# Patient Record
Sex: Female | Born: 1935 | Race: White | Hispanic: No | Smoking: Never smoker
Health system: Southern US, Community
[De-identification: ages and names within clinical notes are randomized; demographics above are authoritative.]

## PROBLEM LIST (undated history)

## (undated) DIAGNOSIS — C801 Malignant (primary) neoplasm, unspecified: Secondary | ICD-10-CM

## (undated) DIAGNOSIS — L570 Actinic keratosis: Secondary | ICD-10-CM

## (undated) HISTORY — DX: Actinic keratosis: L57.0

---

## 1999-05-09 ENCOUNTER — Encounter: Payer: Self-pay | Admitting: Specialist

## 1999-05-11 ENCOUNTER — Ambulatory Visit (HOSPITAL_COMMUNITY): Admission: RE | Admit: 1999-05-11 | Discharge: 1999-05-11 | Payer: Self-pay | Admitting: Specialist

## 2001-01-07 ENCOUNTER — Encounter: Admission: RE | Admit: 2001-01-07 | Discharge: 2001-01-07 | Payer: Self-pay | Admitting: Family Medicine

## 2001-01-07 ENCOUNTER — Encounter: Payer: Self-pay | Admitting: Family Medicine

## 2001-07-08 ENCOUNTER — Other Ambulatory Visit: Admission: RE | Admit: 2001-07-08 | Discharge: 2001-07-08 | Payer: Self-pay | Admitting: Family Medicine

## 2002-09-15 ENCOUNTER — Ambulatory Visit (HOSPITAL_COMMUNITY): Admission: RE | Admit: 2002-09-15 | Discharge: 2002-09-15 | Payer: Self-pay | Admitting: Specialist

## 2004-02-01 ENCOUNTER — Other Ambulatory Visit: Admission: RE | Admit: 2004-02-01 | Discharge: 2004-02-01 | Payer: Self-pay | Admitting: Family Medicine

## 2004-06-06 ENCOUNTER — Other Ambulatory Visit: Payer: Self-pay

## 2005-04-27 ENCOUNTER — Ambulatory Visit: Payer: Self-pay | Admitting: Specialist

## 2008-08-20 DIAGNOSIS — C4492 Squamous cell carcinoma of skin, unspecified: Secondary | ICD-10-CM

## 2008-08-20 HISTORY — DX: Squamous cell carcinoma of skin, unspecified: C44.92

## 2011-03-31 ENCOUNTER — Ambulatory Visit
Admission: RE | Admit: 2011-03-31 | Discharge: 2011-03-31 | Disposition: A | Discharge status: Home / Self Care | Payer: Medicare Other | Source: Ambulatory Visit | Attending: Family Medicine | Admitting: Family Medicine

## 2011-03-31 ENCOUNTER — Other Ambulatory Visit: Payer: Self-pay | Admitting: Family Medicine

## 2011-03-31 DIAGNOSIS — R0609 Other forms of dyspnea: Secondary | ICD-10-CM

## 2014-09-29 ENCOUNTER — Ambulatory Visit: Payer: Self-pay | Admitting: Internal Medicine

## 2017-12-18 DIAGNOSIS — C4492 Squamous cell carcinoma of skin, unspecified: Secondary | ICD-10-CM

## 2017-12-18 DIAGNOSIS — C4491 Basal cell carcinoma of skin, unspecified: Secondary | ICD-10-CM

## 2017-12-18 HISTORY — DX: Basal cell carcinoma of skin, unspecified: C44.91

## 2017-12-18 HISTORY — DX: Squamous cell carcinoma of skin, unspecified: C44.92

## 2018-05-29 ENCOUNTER — Other Ambulatory Visit: Payer: Self-pay

## 2018-05-29 ENCOUNTER — Emergency Department
Admission: EM | Admit: 2018-05-29 | Discharge: 2018-05-29 | Disposition: A | Discharge status: Home / Self Care | Payer: Medicare Other | Attending: Emergency Medicine | Admitting: Emergency Medicine

## 2018-05-29 DIAGNOSIS — I4891 Unspecified atrial fibrillation: Secondary | ICD-10-CM | POA: Diagnosis not present

## 2018-05-29 DIAGNOSIS — L7621 Postprocedural hemorrhage and hematoma of skin and subcutaneous tissue following a dermatologic procedure: Secondary | ICD-10-CM | POA: Insufficient documentation

## 2018-05-29 DIAGNOSIS — Z7901 Long term (current) use of anticoagulants: Secondary | ICD-10-CM | POA: Diagnosis not present

## 2018-05-29 DIAGNOSIS — H9221 Otorrhagia, right ear: Secondary | ICD-10-CM

## 2018-05-29 MED ORDER — LIDOCAINE-EPINEPHRINE (PF) 2 %-1:200000 IJ SOLN
10.0000 mL | Freq: Once | INTRAMUSCULAR | Status: AC
  Administered 2018-05-29: 3 mL via INTRADERMAL
  Filled 2018-05-29: qty 10

## 2018-05-29 NOTE — ED Triage Notes (Addendum)
Pt to triage via w/c with no distress noted; granddaughter reports pt had mole removed yesterday from right ear, currently taking eloquist and cont to have bleeding

## 2018-05-29 NOTE — ED Provider Notes (Signed)
[facility] Emergency Department Provider Note   First MD Initiated Contact with Patient 05/29/18 [phone]     (approximate)  I have reviewed the triage vital signs and the nursing notes.   HISTORY  Chief Complaint Post-op Problem    HPI Victoria Pearson is a 84 y.o. female resents to the emergency department with active bleeding from right ear following squamous cell skin cancer removal yesterday.  Patient is currently on Eliquis secondary to atrial fibrillation.  Past medical history Left ear squamous cell cancer removal There are no active problems to display for this patient.   Past surgical history Left ear squamous cell removal  Prior to Admission medications   Not on File    Allergies No known drug allergies No family history on file.  Social History Social History   Tobacco Use  . Smoking status: Not on file  Substance Use Topics  . Alcohol use: Not on file  . Drug use: Not on file    Review of Systems Constitutional: No fever/chills Eyes: No visual changes. ENT: No sore throat.  Active bleeding from right ear Cardiovascular: Denies chest pain. Respiratory: Denies shortness of breath. Gastrointestinal: No abdominal pain.  No nausea, no vomiting.  No diarrhea.  No constipation. Genitourinary: Negative for dysuria. Musculoskeletal: Negative for neck pain.  Negative for back pain. Integumentary: Negative for rash. Neurological: Negative for headaches, focal weakness or numbness.  ____________________________________________   PHYSICAL EXAM:  VITAL SIGNS: ED Triage Vitals  Enc Vitals Group     BP 05/29/18 0345 (!) 179/97     Pulse Rate 05/29/18 0345 87     Resp --      Temp 05/29/18 0345 97.6 F (36.4 C)     Temp Source 05/29/18 0345 Oral     SpO2 05/29/18 0345 97 %     Weight 05/29/18 0342 57.2 kg (126 lb)     Height 05/29/18 0342 1.6 m (5\' 3" )     Head Circumference --      Peak Flow --      Pain Score 05/29/18 0342 0      Pain Loc --      Pain Edu? --      Excl. in [place]? --     Constitutional: Alert and oriented. Well appearing and in no acute distress. Eyes: Conjunctivae are normal. Head: Atraumatic. Ears: Active mild bleeding noted right acoustic meatus at the site of a skin flap Nose: No congestion/rhinnorhea. Mouth/Throat: Mucous membranes are moist.  Oropharynx non-erythematous. Neck: No stridor.  Cardiovascular: Normal rate, regular rhythm. Good peripheral circulation. Grossly normal heart sounds. Neurologic:  Normal speech and language. No gross focal neurologic deficits are appreciated.  Skin:  Skin is warm, dry and intact. No rash noted. Psychiatric: Mood and affect are normal. Speech and behavior are normal.   Procedures   ____________________________________________   INITIAL IMPRESSION / ASSESSMENT AND PLAN / ED COURSE  As part of my medical decision making, I reviewed the following data within the electronic MEDICAL RECORD NUMBER   84 year old female presented to the emergency department above-stated history and physical exam of bleeding from the right acoustic meatus following squamous cell skin cancer removal.  Surgicel applied to the area however bleeding persistent as such 1 mL of lidocaine with epinephrine was inserted subcutaneously following discussion with the patient's surgeon Dr. Lacinda Axon with resolution of bleeding achieved. ____________________________________________  FINAL CLINICAL IMPRESSION(S) / ED DIAGNOSES  Final diagnoses:  Bleeding from the ear, right  MEDICATIONS GIVEN DURING THIS VISIT:  Medications  lidocaine-EPINEPHrine (XYLOCAINE W/EPI) 2 %-1:200000 (PF) injection 10 mL (has no administration in time range)     ED Discharge Orders    None       Note:  This document was prepared using Dragon voice recognition software and may include unintentional dictation errors.    Gregor Hams, MD 05/31/18 [phone]

## 2019-04-16 ENCOUNTER — Other Ambulatory Visit: Payer: Self-pay | Admitting: Internal Medicine

## 2019-04-16 DIAGNOSIS — H9121 Sudden idiopathic hearing loss, right ear: Secondary | ICD-10-CM

## 2019-04-23 ENCOUNTER — Other Ambulatory Visit: Payer: Self-pay

## 2019-04-23 ENCOUNTER — Ambulatory Visit
Admission: RE | Admit: 2019-04-23 | Discharge: 2019-04-23 | Disposition: A | Discharge status: Home / Self Care | Payer: Medicare Other | Source: Ambulatory Visit | Attending: Internal Medicine | Admitting: Internal Medicine

## 2019-04-23 DIAGNOSIS — H9121 Sudden idiopathic hearing loss, right ear: Secondary | ICD-10-CM | POA: Diagnosis not present

## 2019-04-23 HISTORY — DX: Malignant (primary) neoplasm, unspecified: C80.1

## 2019-04-23 LAB — POCT I-STAT CREATININE: Creatinine, Ser: 0.8 mg/dL (ref 0.44–1.00)

## 2019-04-23 MED ORDER — GADOBUTROL 1 MMOL/ML IV SOLN
5.0000 mL | Freq: Once | INTRAVENOUS | Status: AC | PRN
  Administered 2019-04-23: 5 mL via INTRAVENOUS

## 2019-09-05 IMAGING — MR MR BRAIN/IAC WITHOUT AND WITH CONTRAST
10 of 13 series · 25 of 48 positions shown · IV contrast (Gadavist)
Comparison: None.

CLINICAL DATA: [AGE]/o F; history cancer removed from the ear. Right
ear hearing loss for 1 month.

EXAM:
MRI HEAD WITHOUT AND WITH CONTRAST
TECHNIQUE: Multiplanar, multiecho pulse sequences of the brain and surrounding
structures were obtained without and with intravenous contrast.
Internal auditory canal protocol.
CONTRAST:  5 cc Gadavist.

[Series 5: T1 · sagittal · 5.0mm · 0.62mm/px · 2 of 21 slices shown (1 of 3)]
[im 1/21]
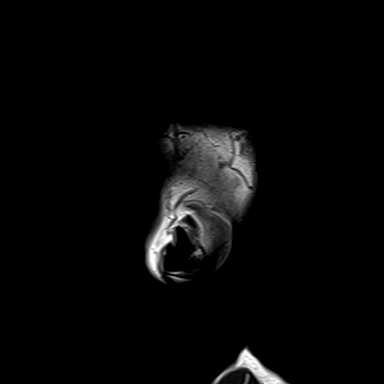
[im 21/21]
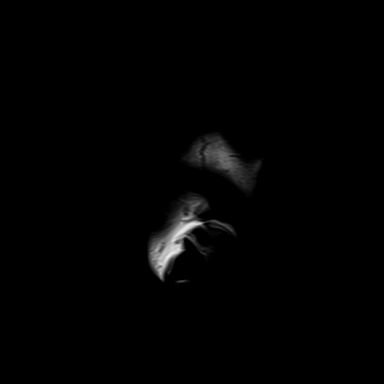

[Series 6: ax dwi_tracew · axial · 3.0mm · 0.73mm/px · z∈[-45,+103]mm · 4 of 52 slices shown]
[im 1/52]
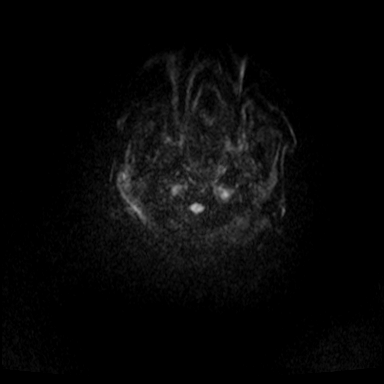
[im 18/52]
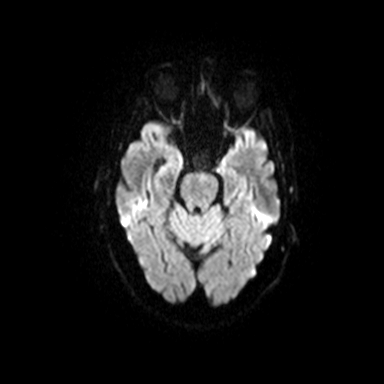
[im 35/52]
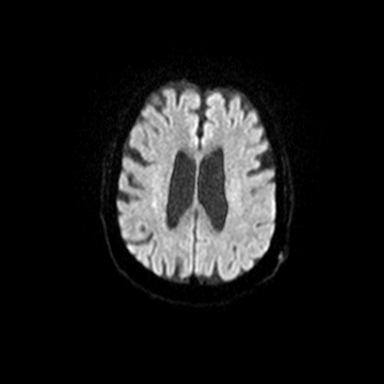
[im 52/52]
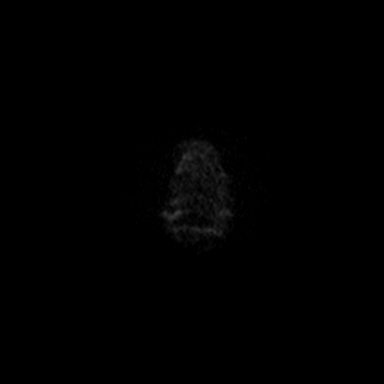

[Series 7: ax dwi_adc · axial · 3.0mm · 0.73mm/px · 1 of 52 slices shown]
[im 1/52]
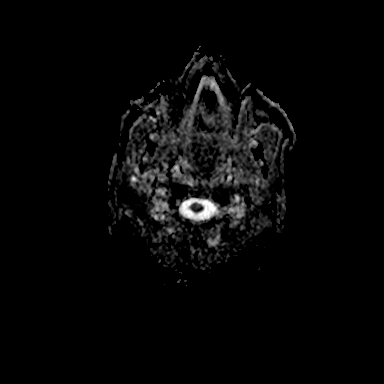

[Series 8: T2 · axial · 5.0mm · 0.53mm/px · z∈[-47,+91]mm · 2 of 25 slices shown]
[im 1/25]
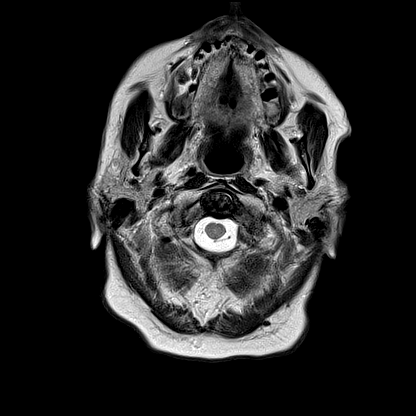
[im 25/25]
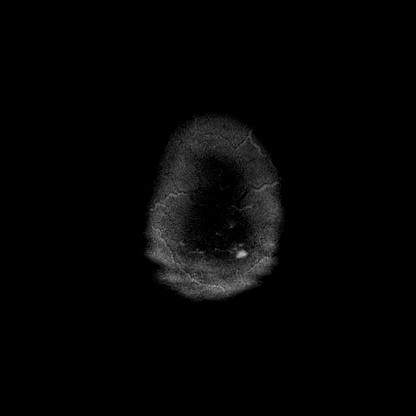

[Series 13: FLAIR · axial · 3.0mm · 0.53mm/px · z∈[-56,+100]mm · 4 of 55 slices shown]
[im 1/55]
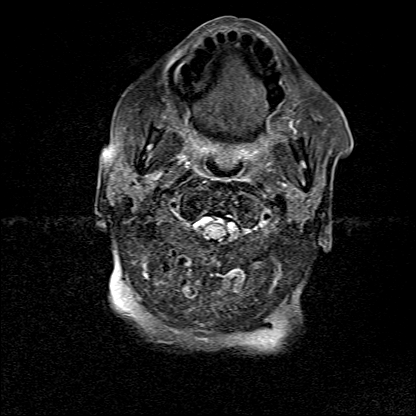
[im 19/55]
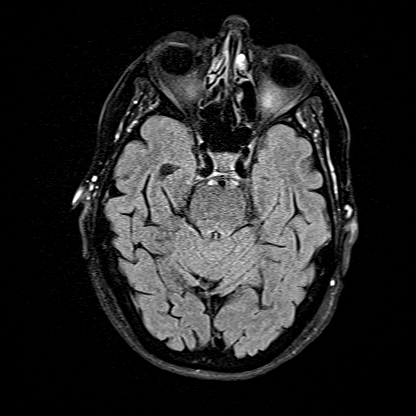
[im 37/55]
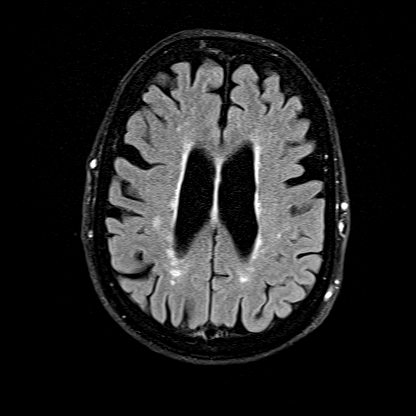
[im 55/55]
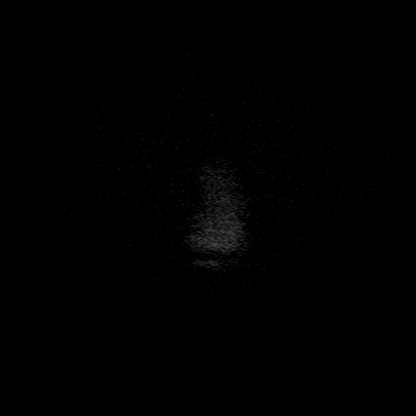

[Series 14: T1 · coronal · non-contrast · 3.0mm · 0.21mm/px · 1 of 13 slices shown (2 of 3)]
[im 1/13]
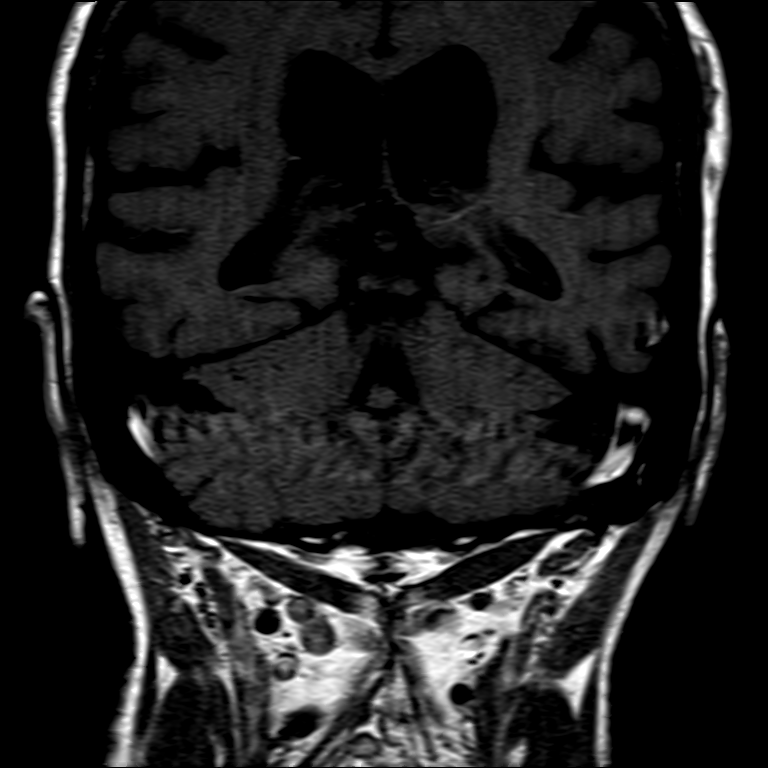

[Series 16: T1 · axial · non-contrast · 3.0mm · 0.21mm/px · 1 of 15 slices shown (3 of 3)]
[im 1/15]
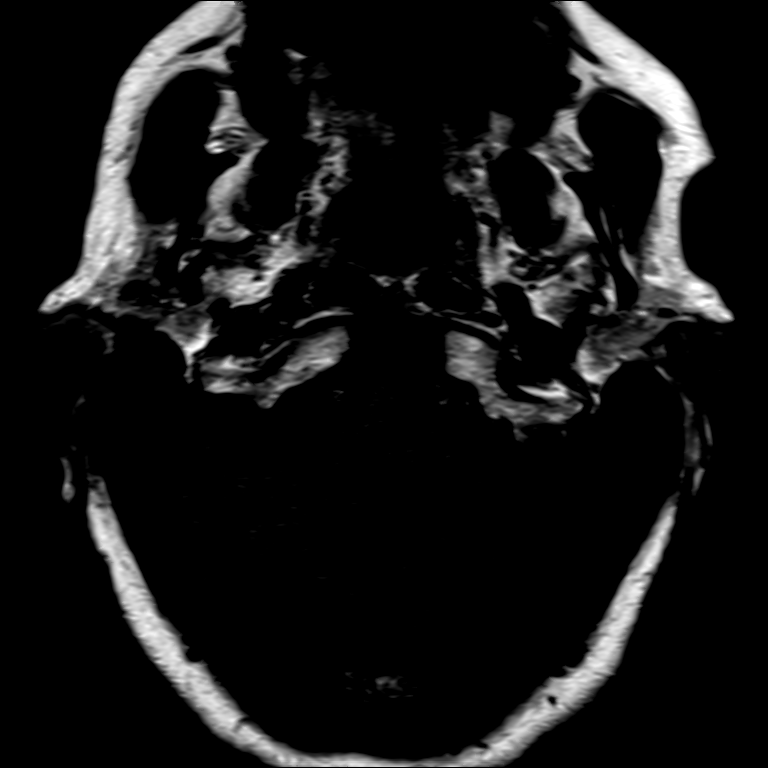

[Series 17: T1 post-contrast · axial · 3.0mm · 0.21mm/px · 1 of 15 slices shown (1 of 3)]
[im 1/15]
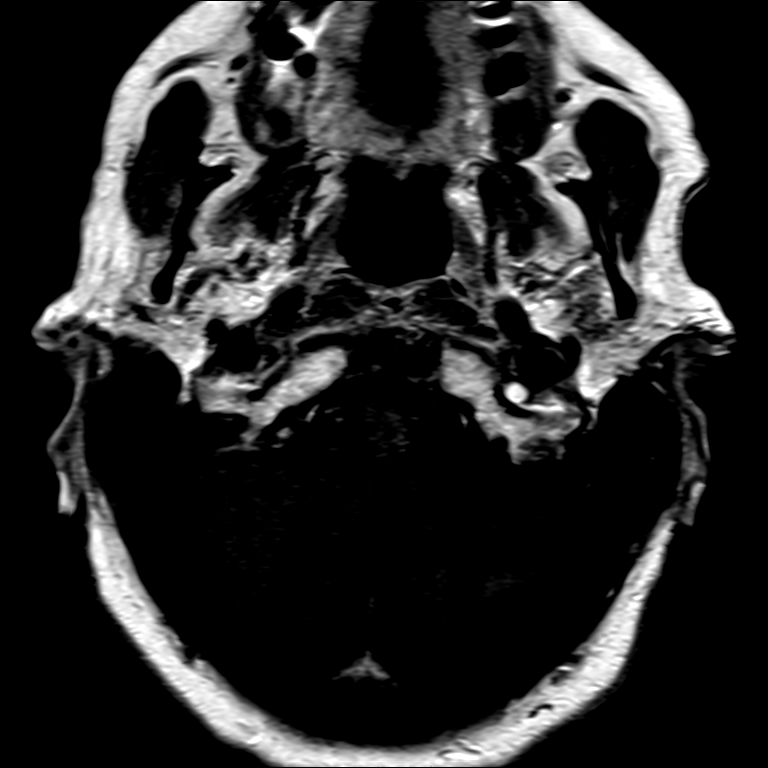

[Series 18: T1 post-contrast · coronal · 3.0mm · 0.21mm/px · 1 of 13 slices shown (2 of 3)]
[im 1/13]
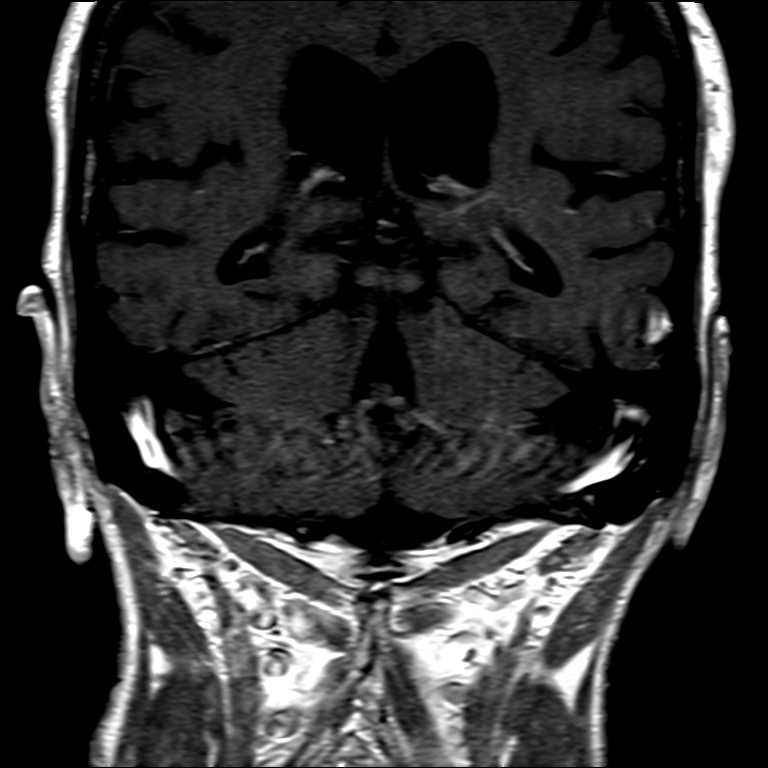

[Series 19: T1 post-contrast · axial · 1.0mm · 0.98mm/px · z∈[-59,+110]mm · 8 of 176 slices shown (3 of 3)]
[im 1/176]
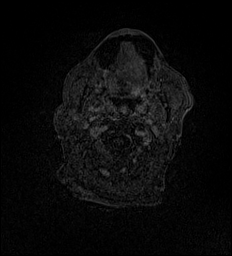
[im 27/176]
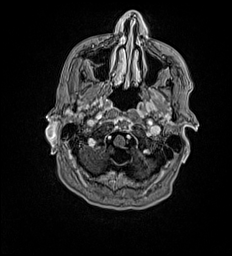
[im 54/176]
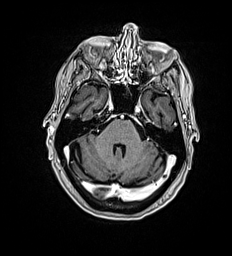
[im 81/176]
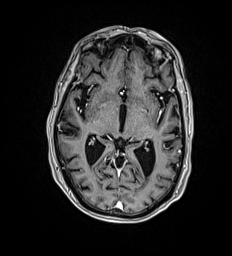
[im 95/176]
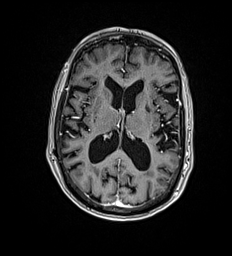
[im 122/176]
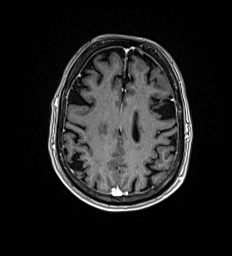
[im 149/176]
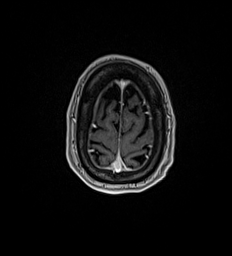
[im 176/176]
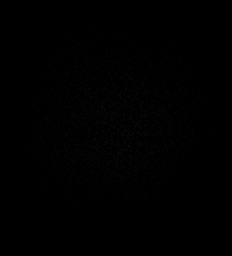

[25 of 48 positions shown; findings below may reference images not displayed]

FINDINGS: Internal auditory canal: No mass or abnormal enhancement. Cranial
nerve 7 and 8 complexes are intact. Inner ear structures are
morphologically normal and demonstrate normal signal. No high-riding
jugular bulb. No neurovascular compression. Mild non masslike
enhancement of the scalp posterior to the right external auditory
canal.

Brain: No acute infarction, hemorrhage, hydrocephalus, extra-axial
collection or mass lesion. Small hemosiderin stained chronic
infarcts are present within the left putamen and caudate head. Early
confluent nonspecific T2 FLAIR hyperintensities in subcortical and
periventricular white matter are compatible with moderate chronic
microvascular ischemic changes. Moderate volume loss of the brain.

Vascular: Normal flow voids.

Skull and upper cervical spine: Normal marrow signal.

Sinuses/Orbits: Mild mucosal thickening of the anterior ethmoid air
cells. Right maxillary sinus mucous retention cyst. Trace
opacification of mastoid tip air cells bilaterally. Bilateral
intra-ocular lens replacement.

Other: None.
IMPRESSION: 1. No mass or abnormal enhancement of the internal auditory canals.
Trace opacification of bilateral mastoid tip air cells.
2. Mild non masslike enhancement of the scalp posterior to the right
external auditory canal, question site of lesion resection.
3. No acute intracranial process or abnormal enhancement of the
brain.
4. Moderate chronic microvascular ischemic changes and volume loss
of the brain.

## 2020-02-13 ENCOUNTER — Ambulatory Visit: Payer: Medicare Other | Attending: Internal Medicine

## 2020-02-13 DIAGNOSIS — Z23 Encounter for immunization: Secondary | ICD-10-CM

## 2020-02-13 NOTE — Progress Notes (Signed)
   Covid-19 Vaccination Clinic  Name:  Victoria Pearson    MRN: [id number] DOB: 1925-06-21  02/13/2020  [name] was observed post Covid-19 immunization for 15 minutes without incidence. She was provided with Vaccine Information Sheet and instruction to access the V-Safe system.   [name] was instructed to call 911 with any severe reactions post vaccine: Marland Kitchen Difficulty breathing  . Swelling of your face and throat  . A fast heartbeat  . A bad rash all over your body  . Dizziness and weakness    Immunizations Administered    Name Date Dose VIS Date Route   Pfizer COVID-19 Vaccine 02/13/2020  1:07 PM 0.3 mL 11/28/2019 Intramuscular   Manufacturer: [place]   Lot: [id number]   [place]: [id number]

## 2020-03-16 ENCOUNTER — Ambulatory Visit: Payer: Medicare Other | Attending: Internal Medicine

## 2020-03-16 DIAGNOSIS — Z23 Encounter for immunization: Secondary | ICD-10-CM

## 2020-03-16 NOTE — Progress Notes (Signed)
   Covid-19 Vaccination Clinic  Name:  Victoria Pearson    MRN: [id number] DOB: 06-29-1925  03/16/2020  [name] was observed post Covid-19 immunization for 15 minutes without incident. She was provided with Vaccine Information Sheet and instruction to access the V-Safe system.   [name] was instructed to call 911 with any severe reactions post vaccine: Marland Kitchen Difficulty breathing  . Swelling of face and throat  . A fast heartbeat  . A bad rash all over body  . Dizziness and weakness   Immunizations Administered    Name Date Dose VIS Date Route   Pfizer COVID-19 Vaccine 03/16/2020 10:24 AM 0.3 mL 11/28/2019 Intramuscular   Manufacturer: [place]   Lot: [id number]   [place]: [id number]

## 2020-04-06 ENCOUNTER — Ambulatory Visit (INDEPENDENT_AMBULATORY_CARE_PROVIDER_SITE_OTHER): Payer: Medicare Other | Admitting: Dermatology

## 2020-04-06 ENCOUNTER — Encounter: Payer: Self-pay | Admitting: Dermatology

## 2020-04-06 ENCOUNTER — Other Ambulatory Visit: Payer: Self-pay

## 2020-04-06 DIAGNOSIS — Z85828 Personal history of other malignant neoplasm of skin: Secondary | ICD-10-CM

## 2020-04-06 DIAGNOSIS — D1801 Hemangioma of skin and subcutaneous tissue: Secondary | ICD-10-CM | POA: Diagnosis not present

## 2020-04-06 DIAGNOSIS — L570 Actinic keratosis: Secondary | ICD-10-CM

## 2020-04-06 DIAGNOSIS — L565 Disseminated superficial actinic porokeratosis (DSAP): Secondary | ICD-10-CM | POA: Diagnosis not present

## 2020-04-06 DIAGNOSIS — L578 Other skin changes due to chronic exposure to nonionizing radiation: Secondary | ICD-10-CM

## 2020-04-06 DIAGNOSIS — Z1283 Encounter for screening for malignant neoplasm of skin: Secondary | ICD-10-CM | POA: Diagnosis not present

## 2020-04-06 DIAGNOSIS — D1800 Hemangioma unspecified site: Secondary | ICD-10-CM

## 2020-04-06 DIAGNOSIS — L720 Epidermal cyst: Secondary | ICD-10-CM

## 2020-04-06 DIAGNOSIS — L821 Other seborrheic keratosis: Secondary | ICD-10-CM

## 2020-04-06 DIAGNOSIS — L814 Other melanin hyperpigmentation: Secondary | ICD-10-CM

## 2020-04-06 NOTE — Patient Instructions (Addendum)
Recommend daily broad spectrum sunscreen SPF 30+ to sun-exposed areas, reapply every 2 hours as needed. Call for new or changing lesions.  Cryotherapy Aftercare  . Wash gently with soap and water everyday.   . Apply Vaseline and Band-Aid daily until healed.  

## 2020-04-06 NOTE — Progress Notes (Signed)
Follow-Up Visit   Subjective  Victoria Pearson is a 86 y.o. female who presents for the following: Annual Exam.  Patient here today for TBSE. She has a history of BCC and SCC. She is not aware og anything new or changing today. She has a tender scaly spot on her leg that is a little better now.  The following portions of the chart were reviewed this encounter and updated as appropriate:     Review of Systems:  No other skin or systemic complaints except as noted in HPI or Assessment and Plan.  Objective  Well appearing patient in no apparent distress; mood and affect are within normal limits.  A full examination was performed including scalp, head, eyes, ears, nose, lips, neck, chest, axillae, abdomen, back, buttocks, bilateral upper extremities, bilateral lower extremities, hands, feet, fingers, toes, fingernails, and toenails. All findings within normal limits unless otherwise noted below.  Objective  Left leg and foot x 6, right leg and foot x 8, Left arm and hand x 3, Right arm and hand x 4, Left lower cheek x 1, Right lower cheek x 1 (23): Keratotic pink/brown scaly papules  Objective  Lower Legs, arms: Numerous pink brown scaly macules and papules with keratotic rim.   Objective  Left Upper Eyebrow: 5.28mm bluish papule  Objective  Right Concha, Right Sideburn: Well healed scar with no evidence of recurrence, no lymphadenopathy.   Objective  Right Postauricular: Well healed scar with no evidence of recurrence.   Objective  Right Glabella: 0.4 x 0.2cm firm white papule   Assessment & Plan  AK (actinic keratosis) (23) Left leg and foot x 6, right leg and foot x 8, Left arm and hand x 3, Right arm and hand x 4, Left lower cheek x 1, Right lower cheek x 1  Hypertrophic  Destruction of lesion - Left leg and foot x 6, right leg and foot x 8, Left arm and hand x 3, Right arm and hand x 4, Left lower cheek x 1, Right lower cheek x 1  Destruction method: cryotherapy     Informed consent: discussed and consent obtained   Lesion destroyed using liquid nitrogen: Yes   Region frozen until ice ball extended beyond lesion: Yes   Outcome: patient tolerated procedure well with no complications   Post-procedure details: wound care instructions given    DSAP (disseminated superficial actinic porokeratosis) Lower Legs, arms  Benign-appearing.  Observation.  Call clinic for new or changing spots.  Recommend daily use of broad spectrum spf 30+ sunscreen to sun-exposed areas.    Hemangioma of skin Left Upper Eyebrow  Benign, observe.     History of SCC (squamous cell carcinoma) of skin Right Concha, Right Sideburn  No evidence of recurrence, call clinic for new or changing lesions.   History of basal cell carcinoma (BCC) Right Postauricular  No evidence of recurrence, call clinic for new or changing lesions.   [place] Right Glabella  Benign, observe.     Skin cancer screening performed today.  Actinic Damage - diffuse scaly erythematous macules with underlying dyspigmentation - Recommend daily broad spectrum sunscreen SPF 30+ to sun-exposed areas, reapply every 2 hours as needed.  - Call for new or changing lesions.  Seborrheic Keratoses - Stuck-on, waxy, tan-brown papules and plaques  - Discussed benign etiology and prognosis. - Observe - Call for any changes  Lentigines - Scattered tan macules - Discussed due to sun exposure - Benign, observe - Call for any changes  Hemangiomas - Red papules - Discussed benign nature - Observe - Call for any changes   Return in about 6 months (around 10/06/2020) for AK FOLLOW UP.  Graciella Belton, RMA, am acting as scribe for Brendolyn Patty, MD .   Documentation: I have reviewed the above documentation for accuracy and completeness, and I agree with the above.  Brendolyn Patty, MD

## 2020-10-12 ENCOUNTER — Ambulatory Visit (INDEPENDENT_AMBULATORY_CARE_PROVIDER_SITE_OTHER): Payer: Medicare Other | Admitting: Dermatology

## 2020-10-12 ENCOUNTER — Other Ambulatory Visit: Payer: Self-pay

## 2020-10-12 DIAGNOSIS — L821 Other seborrheic keratosis: Secondary | ICD-10-CM | POA: Diagnosis not present

## 2020-10-12 DIAGNOSIS — C4492 Squamous cell carcinoma of skin, unspecified: Secondary | ICD-10-CM

## 2020-10-12 DIAGNOSIS — C44729 Squamous cell carcinoma of skin of left lower limb, including hip: Secondary | ICD-10-CM

## 2020-10-12 DIAGNOSIS — L570 Actinic keratosis: Secondary | ICD-10-CM | POA: Diagnosis not present

## 2020-10-12 DIAGNOSIS — L578 Other skin changes due to chronic exposure to nonionizing radiation: Secondary | ICD-10-CM

## 2020-10-12 DIAGNOSIS — D485 Neoplasm of uncertain behavior of skin: Secondary | ICD-10-CM

## 2020-10-12 DIAGNOSIS — Z85828 Personal history of other malignant neoplasm of skin: Secondary | ICD-10-CM

## 2020-10-12 DIAGNOSIS — C44722 Squamous cell carcinoma of skin of right lower limb, including hip: Secondary | ICD-10-CM

## 2020-10-12 HISTORY — DX: Personal history of other malignant neoplasm of skin: Z85.828

## 2020-10-12 MED ORDER — MUPIROCIN 2 % EX OINT: TOPICAL_OINTMENT | CUTANEOUS | 0 refills | Status: DC

## 2020-10-12 NOTE — Progress Notes (Signed)
Follow-Up Visit   Subjective  Victoria Pearson is a 86 y.o. female who presents for the following: Follow-up (6 mo f/u AKs) and Spot Check (Pt has a spot on left lower leg and right lower leg that she would like checked).  They are new, growing and tender.    The following portions of the chart were reviewed this encounter and updated as appropriate:     Review of Systems: No other skin or systemic complaints except as noted in HPI or Assessment and Plan.   Objective  Well appearing patient in no apparent distress; mood and affect are within normal limits.  A focused examination was performed including face, scalp, chest, arms, hands, legs, and feet. Relevant physical exam findings are noted in the Assessment and Plan.  Objective  right lower pretibia: 1.5 cm pink keratotic nodule     Objective  left lateral upper ankle: 1 cm pink keratotic papule     Objective  right ankle x 3, right foot x 1, right medial calf x 1, left pretibia x 1, right hand x 1, right elbow x 1, upper sternum x 1, right antihelix x 1, scalp x 1 (11): Erythematous thin papules/macules with gritty scale.  Keratotic macules/papules  Assessment & Plan  Neoplasm of uncertain behavior of skin (2) right lower pretibia  Skin / nail biopsy Type of biopsy: tangential   Informed consent: discussed and consent obtained   Anesthesia: the lesion was anesthetized in a standard fashion   Anesthesia comment:  Area prepped with alcohol Anesthetic:  1% lidocaine w/ epinephrine 1-100,000 buffered w/ 8.4% NaHCO3 Instrument used: flexible razor blade   Hemostasis achieved with: pressure, aluminum chloride and electrodesiccation   Outcome: patient tolerated procedure well    Destruction of lesion  Destruction method: electrodesiccation and curettage   Timeout:  patient name, date of birth, surgical site, and procedure verified Curettage performed in three different directions: Yes   Electrodesiccation  performed over the curetted area: Yes   Lesion length (cm):  1.5 Lesion width (cm):  1.5 Margin per side (cm):  0.2 Final wound size (cm):  1.9 Hemostasis achieved with:  pressure, aluminum chloride and electrodesiccation Outcome: patient tolerated procedure well with no complications   Post-procedure details: wound care instructions given   Additional details:  Mupirocin ointment and Bandaid applied    mupirocin ointment (BACTROBAN) 2 %  Specimen 1 - Surgical pathology Differential Diagnosis: Rule out SCC Check Margins: No 1.5 cm pink keratotic nodule  left lateral upper ankle  Skin / nail biopsy Type of biopsy: tangential   Informed consent: discussed and consent obtained   Anesthesia: the lesion was anesthetized in a standard fashion   Anesthesia comment:  Area prepped with alcohol Anesthetic:  1% lidocaine w/ epinephrine 1-100,000 buffered w/ 8.4% NaHCO3 Instrument used: flexible razor blade   Hemostasis achieved with: pressure, aluminum chloride and electrodesiccation   Outcome: patient tolerated procedure well    Destruction of lesion  Destruction method: electrodesiccation and curettage   Timeout:  patient name, date of birth, surgical site, and procedure verified Curettage performed in three different directions: Yes   Electrodesiccation performed over the curetted area: Yes   Lesion length (cm):  1 Lesion width (cm):  1 Margin per side (cm):  0.2 Final wound size (cm):  1.4 Hemostasis achieved with:  pressure, aluminum chloride and electrodesiccation Outcome: patient tolerated procedure well with no complications   Post-procedure details: wound care instructions given   Additional details:  Mupirocin ointment  and Bandaid applied    mupirocin ointment (BACTROBAN) 2 %  Specimen 2 - Surgical pathology Differential Diagnosis: ISK vs SCC Check Margins: No 1 cm pink keratotic papule  Apply mupirocin 2% ointment to both biopsy sites once a day and cover with  bandage.  Wear compression hose during day  AK (actinic keratosis) (11) right ankle x 3, right foot x 1, right medial calf x 1, left pretibia x 1, right hand x 1, right elbow x 1, upper sternum x 1, right antihelix x 1, scalp x 1  Hypertrophic Aks vs ISKs  Destruction of lesion - right ankle x 3, right foot x 1, right medial calf x 1, left pretibia x 1, right hand x 1, right elbow x 1, upper sternum x 1, right antihelix x 1, scalp x 1  Destruction method: cryotherapy   Informed consent: discussed and consent obtained   Lesion destroyed using liquid nitrogen: Yes   Region frozen until ice ball extended beyond lesion: Yes   Outcome: patient tolerated procedure well with no complications   Post-procedure details: wound care instructions given   Additional details:  Prior to procedure, discussed risks of blister formation, small wound, skin dyspigmentation, or rare scar following cryotherapy.   Seborrheic Keratoses - Stuck-on, waxy, tan-brown papules and plaques  - Discussed benign etiology and prognosis. - Observe - Call for any changes  Actinic Damage - diffuse scaly erythematous macules with underlying dyspigmentation - Recommend daily broad spectrum sunscreen SPF 30+ to sun-exposed areas, reapply every 2 hours as needed.  - Call for new or changing lesions.    Return in about 1 month (around 11/12/2020) for wound check.   I, Harriett Sine, CMA, am acting as scribe for Brendolyn Patty, MD.  Documentation: I have reviewed the above documentation for accuracy and completeness, and I agree with the above.  Brendolyn Patty MD

## 2020-10-12 NOTE — Patient Instructions (Addendum)
Wound Care Instructions  1. Cleanse wound gently with soap and water once a day then pat dry with clean gauze. Apply a thing coat of Mupirocin (Bactroban) over the wound (unless you have an allergy to this). We recommend that you use a new, sterile tube of Mupirocin. Do not pick or remove scabs. Do not remove the yellow or white "healing tissue" from the base of the wound.  2. Cover the wound with fresh, clean, nonstick gauze and secure with paper tape. You may use Band-Aids in place of gauze and tape if the would is small enough, but would recommend trimming much of the tape off as there is often too much. Sometimes Band-Aids can irritate the skin.  3. You should call the office for your biopsy report after 1 week if you have not already been contacted.  4. If you experience any problems, such as abnormal amounts of bleeding, swelling, significant bruising, significant pain, or evidence of infection, please call the office immediately.  5. FOR ADULT SURGERY PATIENTS: If you need something for pain relief you may take 1 extra strength Tylenol (acetaminophen) AND 2 Ibuprofen (200mg  each) together every 4 hours as needed for pain. (do not take these if you are allergic to them or if you have a reason you should not take them.) Typically, you may only need pain medication for 1 to 3 days.

## 2020-10-18 ENCOUNTER — Telehealth: Payer: Self-pay

## 2020-10-18 NOTE — Telephone Encounter (Signed)
Advised patient both biopsies were SCC and were treated with EDC at the time of biopsy.

## 2020-10-18 NOTE — Telephone Encounter (Signed)
-----   Message from Brendolyn Patty, MD sent at 10/18/2020  9:58 AM EDT ----- 1. Skin , right lower pretibia WELL DIFFERENTIATED SQUAMOUS CELL CARCINOMA 2. Skin , left lateral upper ankle WELL DIFFERENTIATED SQUAMOUS CELL CARCINOMA  Both SCC skin cancers and both were treated at time of biopsy with [facility]

## 2020-11-16 ENCOUNTER — Ambulatory Visit (INDEPENDENT_AMBULATORY_CARE_PROVIDER_SITE_OTHER): Payer: Medicare Other | Admitting: Dermatology

## 2020-11-16 ENCOUNTER — Other Ambulatory Visit: Payer: Self-pay

## 2020-11-16 DIAGNOSIS — L578 Other skin changes due to chronic exposure to nonionizing radiation: Secondary | ICD-10-CM | POA: Diagnosis not present

## 2020-11-16 DIAGNOSIS — Z85828 Personal history of other malignant neoplasm of skin: Secondary | ICD-10-CM | POA: Diagnosis not present

## 2020-11-16 DIAGNOSIS — L570 Actinic keratosis: Secondary | ICD-10-CM | POA: Diagnosis not present

## 2020-11-16 NOTE — Progress Notes (Signed)
   Follow-Up Visit   Subjective  Victoria Pearson is a 86 y.o. female who presents for the following: Wound Check (check bx sites, left lateral upper ankle and right lowe pretibia, both SCC treated wtih EDC last visit, ) and Follow-up (AK f/u for right ankle x 3, right foot x 1, right medial calf x 1, left pretibia x 1, right hand x 1, right elbow x 1, upper sternum x 1, right antihelix x 1, scalp x 1).    The following portions of the chart were reviewed this encounter and updated as appropriate:     Review of Systems: No other skin or systemic complaints except as noted in HPI or Assessment and Plan.   Objective  Well appearing patient in no apparent distress; mood and affect are within normal limits.  A focused examination was performed including scalp, arms, legs, right foot. Relevant physical exam findings are noted in the Assessment and Plan.  Objective  Right Lower Pretibia, left lateral upper ankle: Biopsy proven SCC, treated with EDC, Well healed scar with no evidence of recurrence  Objective  left upper pretibia x 2 (2): Pink scaly papules, other previously treated AK sites are clear  Assessment & Plan  History of SCC (squamous cell carcinoma) of skin (2) left lateral upper ankle; Right Lower Pretibia  Clear. Observe for recurrence. Call clinic for new or changing lesions.  Recommend regular skin exams, daily broad-spectrum spf 30+ sunscreen use, and photoprotection.     AK (actinic keratosis) (2) left upper pretibia x 2  Prior to procedure, discussed risks of blister formation, small wound, skin dyspigmentation, or rare scar following cryotherapy.      Destruction of lesion - left upper pretibia x 2  Destruction method: cryotherapy   Informed consent: discussed and consent obtained   Lesion destroyed using liquid nitrogen: Yes   Region frozen until ice ball extended beyond lesion: Yes   Outcome: patient tolerated procedure well with no complications     Post-procedure details: wound care instructions given    Actinic Damage - chronic, secondary to cumulative UV radiation exposure/sun exposure over time - diffuse scaly erythematous macules with underlying dyspigmentation - Recommend daily broad spectrum sunscreen SPF 30+ to sun-exposed areas, reapply every 2 hours as needed.  - Call for new or changing lesions.   Return in about 6 months (around 05/16/2021) for TBSE.   I, Harriett Sine, CMA, am acting as scribe for Brendolyn Patty, MD.  Documentation: I have reviewed the above documentation for accuracy and completeness, and I agree with the above.  Brendolyn Patty MD

## 2021-04-25 ENCOUNTER — Ambulatory Visit: Payer: Medicare Other | Admitting: Dermatology

## 2022-10-09 ENCOUNTER — Other Ambulatory Visit: Payer: Self-pay | Admitting: Otolaryngology

## 2022-10-09 DIAGNOSIS — R221 Localized swelling, mass and lump, neck: Secondary | ICD-10-CM

## 2022-10-26 ENCOUNTER — Ambulatory Visit
Admission: RE | Admit: 2022-10-26 | Discharge: 2022-10-26 | Disposition: A | Discharge status: Home / Self Care | Payer: Medicare Other | Source: Ambulatory Visit | Attending: Otolaryngology | Admitting: Otolaryngology

## 2022-10-26 DIAGNOSIS — R221 Localized swelling, mass and lump, neck: Secondary | ICD-10-CM

## 2022-10-26 MED ORDER — IOPAMIDOL (ISOVUE-300) INJECTION 61%
75.0000 mL | Freq: Once | INTRAVENOUS | Status: AC | PRN
  Administered 2022-10-26: 75 mL via INTRAVENOUS

## 2022-11-01 ENCOUNTER — Other Ambulatory Visit: Payer: Self-pay | Admitting: Otolaryngology

## 2022-11-01 DIAGNOSIS — R599 Enlarged lymph nodes, unspecified: Secondary | ICD-10-CM

## 2022-11-03 ENCOUNTER — Ambulatory Visit
Admission: RE | Admit: 2022-11-03 | Discharge: 2022-11-03 | Disposition: A | Discharge status: Home / Self Care | Payer: Medicare Other | Source: Ambulatory Visit | Attending: Otolaryngology | Admitting: Otolaryngology

## 2022-11-03 DIAGNOSIS — C770 Secondary and unspecified malignant neoplasm of lymph nodes of head, face and neck: Secondary | ICD-10-CM | POA: Diagnosis not present

## 2022-11-03 DIAGNOSIS — R599 Enlarged lymph nodes, unspecified: Secondary | ICD-10-CM | POA: Insufficient documentation

## 2022-11-03 MED ORDER — LIDOCAINE HCL (PF) 1 % IJ SOLN
10.0000 mL | Freq: Once | INTRAMUSCULAR | Status: AC
  Administered 2022-11-03: 10 mL via INTRADERMAL

## 2022-11-07 LAB — SURGICAL PATHOLOGY

## 2022-11-15 ENCOUNTER — Other Ambulatory Visit: Payer: Medicare Other

## 2022-11-29 ENCOUNTER — Inpatient Hospital Stay: Payer: Medicare Other

## 2022-11-29 ENCOUNTER — Encounter: Payer: Self-pay | Admitting: Oncology

## 2022-11-29 ENCOUNTER — Other Ambulatory Visit: Payer: Self-pay | Admitting: *Deleted

## 2022-11-29 ENCOUNTER — Inpatient Hospital Stay: Payer: Medicare Other | Admitting: Oncology

## 2022-11-29 ENCOUNTER — Inpatient Hospital Stay: Payer: Medicare Other | Attending: Oncology | Admitting: Oncology

## 2022-11-29 VITALS — BP 169/78 | HR 88 | Temp 96.4°F | Wt 124.0 lb

## 2022-11-29 DIAGNOSIS — C7800 Secondary malignant neoplasm of unspecified lung: Secondary | ICD-10-CM | POA: Insufficient documentation

## 2022-11-29 DIAGNOSIS — R918 Other nonspecific abnormal finding of lung field: Secondary | ICD-10-CM | POA: Diagnosis not present

## 2022-11-29 DIAGNOSIS — Z85828 Personal history of other malignant neoplasm of skin: Secondary | ICD-10-CM | POA: Insufficient documentation

## 2022-11-29 DIAGNOSIS — G893 Neoplasm related pain (acute) (chronic): Secondary | ICD-10-CM | POA: Insufficient documentation

## 2022-11-29 DIAGNOSIS — C760 Malignant neoplasm of head, face and neck: Secondary | ICD-10-CM | POA: Insufficient documentation

## 2022-11-29 DIAGNOSIS — C770 Secondary and unspecified malignant neoplasm of lymph nodes of head, face and neck: Secondary | ICD-10-CM | POA: Insufficient documentation

## 2022-11-29 DIAGNOSIS — C779 Secondary and unspecified malignant neoplasm of lymph node, unspecified: Secondary | ICD-10-CM

## 2022-11-29 DIAGNOSIS — C801 Malignant (primary) neoplasm, unspecified: Secondary | ICD-10-CM | POA: Diagnosis present

## 2022-12-02 ENCOUNTER — Encounter: Payer: Self-pay | Admitting: Oncology

## 2022-12-03 ENCOUNTER — Encounter: Payer: Self-pay | Admitting: Oncology

## 2022-12-03 NOTE — Progress Notes (Signed)
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 Kathyrn Sheriff  Date of visit: 12/03/22   History of presenting illness- Patient is a 88 year old female with a past medical history significant for hypertension hyperlipidemia among other medical problems.  She incidentally noticed a right neck mass for which she was seen by ENT Dr. Yvone Neu and underwent a CT soft tissue neck.  CT showed a 3.4 cm mass in the upper right lateral neck with necrotic center.  Mass is in close proximity to the transverse process of the C1 ring and close proximity to the right carotid sheath.  Sternocleidomastoid and deeper muscular neck invasion.  Small nodules in bilateral lung apex concerning for pulmonary metastatic disease.  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  Patient is doing well for her age and lives independently.  Her granddaughter lives opposite to her and is available for helping her out with her ADLs.  Patient denies any pain at the site of neck mass presently  ECOG PS- 2  Pain scale- 0   Review of systems- Review of Systems  Constitutional:  Negative for chills, fever, malaise/fatigue and weight loss.  HENT:  Negative for congestion, ear discharge and nosebleeds.        Right neck mass  Eyes:  Negative for blurred vision.  Respiratory:  Negative for cough, hemoptysis, sputum production, shortness of breath and wheezing.   Cardiovascular:  Negative for chest pain, palpitations, orthopnea and  claudication.  Gastrointestinal:  Negative for abdominal pain, blood in stool, constipation, diarrhea, heartburn, melena, nausea and vomiting.  Genitourinary:  Negative for dysuria, flank pain, frequency, hematuria and urgency.  Musculoskeletal:  Negative for back pain, joint pain and myalgias.  Skin:  Negative for rash.  Neurological:  Negative for dizziness, tingling, focal weakness, seizures, weakness and headaches.  Endo/Heme/Allergies:  Does not bruise/bleed easily.  Psychiatric/Behavioral:  Negative for depression and suicidal ideas. The patient does not have insomnia.     Allergies  Allergen Reactions   Amoxicillin Itching    There are no problems to display for this patient.    Past Medical History:  Diagnosis Date   Actinic keratosis    Basal cell carcinoma 04/02/2018   Right postauricular. Nodular pattern, ulcerated.    Cancer ([place])    History of SCC (squamous cell carcinoma) of skin 10/12/2020   right lower pretibia  and left lateral upper ankle   Squamous cell carcinoma of skin 2019   Right concha ear.    Squamous cell carcinoma of skin 08/20/2008   Right preauricular. WD SCC with superficial  infiltration. Excised 10/01/2008.   Squamous cell carcinoma of skin 10/01/2018   Right sideburn. WD SCC.     History reviewed. No pertinent surgical history.  Social History   Socioeconomic History   Marital status: Widowed    Spouse name: Not on file   Number of children: Not on file   Years of education: Not on file   Highest education level: Not on file  Occupational History   Not on  file  Tobacco Use   Smoking status: Never   Smokeless tobacco: Not on file  Substance and Sexual Activity   Alcohol use: Not Currently   Drug use: Not Currently   Sexual activity: Not Currently  Other Topics Concern   Not on file  Social History Narrative   Not on file   Social Determinants of Health   Financial Resource Strain: Not on file  Food Insecurity: Not on file   Transportation Needs: Not on file  Physical Activity: Not on file  Stress: Not on file  Social Connections: Not on file  Intimate Partner Violence: Not on file     History reviewed. No pertinent family history.   Current Outpatient Medications:    ELIQUIS 5 MG TABS tablet, Take 5 mg by mouth 2 (two) times daily., Disp: , Rfl:    felodipine (PLENDIL) 10 MG 24 hr tablet, Take by mouth., Disp: , Rfl:    hydrochlorothiazide (HYDRODIURIL) 25 MG tablet, Take 25 mg by mouth daily., Disp: , Rfl:    metoprolol succinate (TOPROL-XL) 25 MG 24 hr tablet, Take by mouth., Disp: , Rfl:    omeprazole (PRILOSEC) 20 MG capsule, TAKE 1 CAPSULE DAILY, Disp: , Rfl:    potassium chloride SA (KLOR-CON) 20 MEQ tablet, TAKE 2 TABLETS ONCE DAILY, Disp: , Rfl:    acetaminophen (TYLENOL) 650 MG CR tablet, Take by mouth., Disp: , Rfl:    aspirin EC 81 MG tablet, Take by mouth., Disp: , Rfl:    hydrALAZINE (APRESOLINE) 50 MG tablet, Take 50 mg by mouth 2 (two) times daily., Disp: , Rfl:    losartan (COZAAR) 100 MG tablet, Take 100 mg by mouth daily., Disp: , Rfl:    mupirocin ointment (BACTROBAN) 2 %, Apply to aa's once a day (Patient not taking: Reported on 11/29/2022), Disp: 30 g, Rfl: 0   Physical exam:  Vitals:   11/29/22 1332  BP: (!) 169/78  Pulse: 88  Temp: (!) 96.4 F (35.8 C)  TempSrc: Tympanic  SpO2: 99%  Weight: 124 lb (56.2 kg)   Physical Exam Neck:     Comments: There is a hard palpable ill-defined mass roughly measuring 4 cm in the lateral aspect of the right neck.  No other palpable areas of cervical adenopathy. Cardiovascular:     Rate and Rhythm: Normal rate and regular rhythm.     Heart sounds: Normal heart sounds.  Pulmonary:     Effort: Pulmonary effort is normal.     Breath sounds: Normal breath sounds.  Abdominal:     General: Bowel sounds are normal.     Palpations: Abdomen is soft.  Skin:    General: Skin is warm and dry.  Neurological:     Mental Status: She is alert  and oriented to person, place, and time.           Latest Ref Rng & Units 04/23/2019    5:56 PM  CMP  Creatinine 0.44 - 1.00 mg/dL 0.80        No data to display          No images are attached to the encounter.  [place] CORE BIOPSY (LYMPH NODES)  Result Date: 11/03/2022 INDICATION: Enlarged right neck mass/lymph node EXAM: Ultrasound-guided core needle biopsy of right neck mass/lymph node MEDICATIONS: None. ANESTHESIA/SEDATION: Local analgesia COMPLICATIONS: None immediate. PROCEDURE: Informed written consent was obtained from the patient after a thorough discussion of the procedural risks, benefits and alternatives. All questions were addressed. Maximal Sterile Barrier Technique  was utilized including caps, mask, sterile gowns, sterile gloves, sterile drape, hand hygiene and skin antiseptic. A timeout was performed prior to the initiation of the procedure. The patient was placed supine on the exam table. Ultrasound of the right neck demonstrated enlarged heterogeneous right neck mass/lymph node which appeared to invade the local muscular structures resulting in indistinct borders. Skin entry site was marked, and the overlying skin was prepped draped in the standard sterile fashion. Local analgesia was obtained with 1% lidocaine. Using ultrasound guidance, core needle biopsy was performed of the right neck mass/lymph node using an 18 gauge core biopsy device x6 total passes. Specimens were submitted in saline to pathology for further handling. Limited postprocedure imaging demonstrated no hematoma. A clean dressing was placed after manual hemostasis. The patient tolerated the procedure well without immediate complication. IMPRESSION: Successful ultrasound-guided core needle biopsy of right neck mass/lymph node. The lesion in question appears to invade the local muscular structures by ultrasound. Electronically Signed   By: Albin Felling M.D.   On: 11/03/2022 14:18    Assessment and plan- Patient  is a 88 y.o. 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  Patient has a roughly 3.8 cm right cervical nodal mass in close proximity to the carotid sheath and the C1 transverse process.  There is also concern for bilateral lung nodules in the lung apex which was incompletely imaged but concerning for metastatic disease.  I recommend getting a PET CT scan to get her complete staging workup.  Also discussed the fact that this is an HPV positive malignancy this appears to have come from the head and neck region.  It is unlikely that this would be related to her squamous cell carcinoma of the ear that was removed in 2020.  Hopefully PET CT scan should show [place] the site of the primary which can be anywhere at the base of the tongue tonsil of the oropharynx.  Sometimes if PET scan is not suggestive of primary in an ideal situation patients need random ENT biopsies to a certain the primary but given her age that would not be an option.  Patient is fairly asymptomatic with her right neck mass at this time but the concern remains that there is a mass grows it can potentially involve her C1 vertebra and subsequently the spine as well as erosion into the carotid artery causing catastrophe bleeding.  If PET CT scan confirms that there are metastatic lung nodule then concurrent chemoradiation would not be curative for her.  Palliative radiation could be offered and I did discuss this with Dr. Donella Stade who ideally recommend 7 weeks of radiation.  I did discuss risks and benefits of local radiation including mucositis pain and difficulty swallowing and potential need for feeding tube placement should palliative radiation be pursued.  Patient does not wish to proceed with that option.  She is not interested in getting a prophylactic feeding tube in anticipation of  radiation.  Patient is also not interested in pursuing palliative chemotherapy although weekly CarboTaxol is a consideration.  I would like to check a PD-L1 CPS score on her tumor specimen.  If her CPS score is greater than 20 there is a good likelihood that she can respond to single agent immunotherapy like Keytruda.  If her combined CPS score is greater than equal to 1-data is for combination chemotherapy along with  immunotherapy which would be a challenge at her age.  Further treatment plan will be decided based on PET scan and PD-L1 CPS score on her tumor specimen.  I will tentatively see her back in a week to 10 days time to discuss further management.  Patient and her granddaughter verbalized understanding of the plan   Thank you for this kind referral and the opportunity to participate in the care of this  Patient   Visit Diagnosis No diagnosis found.  Dr. Randa Evens, MD, MPH [facility] at [facility] [phone] 12/03/2022

## 2022-12-04 ENCOUNTER — Other Ambulatory Visit: Payer: Self-pay | Admitting: Oncology

## 2022-12-04 MED ORDER — OXYCODONE HCL 5 MG PO TABS: 5.0000 mg | ORAL_TABLET | Freq: Four times a day (QID) | ORAL | 0 refills | Status: DC | PRN

## 2022-12-06 ENCOUNTER — Encounter: Payer: Self-pay | Admitting: Oncology

## 2022-12-07 ENCOUNTER — Ambulatory Visit
Admission: RE | Admit: 2022-12-07 | Discharge: 2022-12-07 | Disposition: A | Discharge status: Home / Self Care | Payer: Medicare Other | Source: Ambulatory Visit | Attending: Oncology | Admitting: Oncology

## 2022-12-07 DIAGNOSIS — C7800 Secondary malignant neoplasm of unspecified lung: Secondary | ICD-10-CM | POA: Diagnosis not present

## 2022-12-07 DIAGNOSIS — I700 Atherosclerosis of aorta: Secondary | ICD-10-CM | POA: Diagnosis not present

## 2022-12-07 DIAGNOSIS — C779 Secondary and unspecified malignant neoplasm of lymph node, unspecified: Secondary | ICD-10-CM

## 2022-12-07 DIAGNOSIS — R590 Localized enlarged lymph nodes: Secondary | ICD-10-CM | POA: Diagnosis present

## 2022-12-07 LAB — GLUCOSE, CAPILLARY: Glucose-Capillary: 98 mg/dL (ref 70–99)

## 2022-12-07 MED ORDER — FLUDEOXYGLUCOSE F - 18 (FDG) INJECTION
6.4100 | Freq: Once | INTRAVENOUS | Status: AC | PRN
  Administered 2022-12-07: 6.41 via INTRAVENOUS

## 2022-12-13 ENCOUNTER — Other Ambulatory Visit: Payer: Self-pay | Admitting: *Deleted

## 2022-12-13 ENCOUNTER — Inpatient Hospital Stay (HOSPITAL_BASED_OUTPATIENT_CLINIC_OR_DEPARTMENT_OTHER): Payer: Medicare Other | Admitting: Oncology

## 2022-12-13 ENCOUNTER — Encounter: Payer: Self-pay | Admitting: Oncology

## 2022-12-13 VITALS — BP 145/81 | HR 80 | Temp 96.0°F | Resp 16 | Wt 122.0 lb

## 2022-12-13 DIAGNOSIS — C770 Secondary and unspecified malignant neoplasm of lymph nodes of head, face and neck: Secondary | ICD-10-CM | POA: Diagnosis not present

## 2022-12-13 DIAGNOSIS — C760 Malignant neoplasm of head, face and neck: Secondary | ICD-10-CM

## 2022-12-13 DIAGNOSIS — G893 Neoplasm related pain (acute) (chronic): Secondary | ICD-10-CM | POA: Diagnosis not present

## 2022-12-13 DIAGNOSIS — Z7189 Other specified counseling: Secondary | ICD-10-CM

## 2022-12-13 MED ORDER — XTAMPZA ER 9 MG PO C12A: 9.0000 mg | EXTENDED_RELEASE_CAPSULE | Freq: Every day | ORAL | 0 refills | Status: DC

## 2022-12-13 NOTE — Progress Notes (Unsigned)
Pt and granddaughter in for follow up, reports pt continues to have pain to right side of head around the ear area that she takes oxycodone for but states it takes awhile for her to obtain pain relief usually has to tylenol with pain medication.

## 2022-12-14 DIAGNOSIS — C770 Secondary and unspecified malignant neoplasm of lymph nodes of head, face and neck: Secondary | ICD-10-CM | POA: Insufficient documentation

## 2022-12-14 DIAGNOSIS — C760 Malignant neoplasm of head, face and neck: Secondary | ICD-10-CM | POA: Insufficient documentation

## 2022-12-14 NOTE — Progress Notes (Signed)
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  She incidentally noticed a right neck mass for which she was seen by ENT Dr. Yvone Neu and underwent a CT soft tissue neck.  CT showed a 3.4 cm mass in the upper right lateral neck with necrotic center.  Mass is in close proximity to the transverse process of the C1 ring and close proximity to the right carotid sheath.  Sternocleidomastoid and deeper muscular neck invasion.  Small nodules in bilateral lung apex concerning for pulmonary metastatic disease.   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  CPS score was 10.  PET CT scan showed widespread pulmonary metastatic disease.  No evidence of metastatic disease in the abdomen or pelvis.  Interval history-patient is currently on as needed oxycodone for her pain which are mainly headaches.  She feels that the oxycodone does not work fast enough and it wears off soon.  Bowel movements have been regular.  ECOG PS- 2 Pain scale- 3 Opioid associated constipation- no  Review of systems- Review of Systems  Constitutional:  Negative for chills, fever, malaise/fatigue and weight loss.  HENT:  Negative for congestion, ear  discharge and nosebleeds.   Eyes:  Negative for blurred vision.  Respiratory:  Negative for cough, hemoptysis, sputum production, shortness of breath and wheezing.   Cardiovascular:  Negative for chest pain, palpitations, orthopnea and claudication.  Gastrointestinal:  Negative for abdominal pain, blood in stool, constipation, diarrhea, heartburn, melena, nausea and vomiting.  Genitourinary:  Negative for dysuria, flank pain, frequency, hematuria and urgency.  Musculoskeletal:  Negative for back pain, joint pain and myalgias.  Skin:  Negative for rash.  Neurological:  Positive for headaches. Negative for dizziness, tingling, focal weakness, seizures and weakness.  Endo/Heme/Allergies:  Does not bruise/bleed easily.  Psychiatric/Behavioral:  Negative for depression and suicidal ideas. The patient does not have insomnia.       Allergies  Allergen Reactions   Amoxicillin Itching     Past Medical History:  Diagnosis Date   Actinic keratosis    Basal cell carcinoma 04/02/2018   Right postauricular. Nodular pattern, ulcerated.    Cancer ([place])    History of SCC (squamous cell carcinoma) of skin 10/12/2020   right lower pretibia  and left lateral upper ankle   Squamous cell carcinoma of skin 2019   Right concha ear.    Squamous cell carcinoma of skin 08/20/2008   Right preauricular. WD SCC with superficial  infiltration. Excised 10/01/2008.   Squamous cell carcinoma of skin 10/01/2018   Right sideburn. WD SCC.     History reviewed. No pertinent surgical history.  Social History   Socioeconomic History   Marital status: Widowed    Spouse name: Not on file   Number of children: Not on file  Years of education: Not on file   Highest education level: Not on file  Occupational History   Not on file  Tobacco Use   Smoking status: Never   Smokeless tobacco: Never  Substance and Sexual Activity   Alcohol use: Not Currently   Drug use: Not Currently   Sexual activity: Not  Currently  Other Topics Concern   Not on file  Social History Narrative   Not on file   Social Determinants of Health   Financial Resource Strain: Not on file  Food Insecurity: Not on file  Transportation Needs: Not on file  Physical Activity: Not on file  Stress: Not on file  Social Connections: Not on file  Intimate Partner Violence: Not on file    History reviewed. No pertinent family history.   Current Outpatient Medications:    acetaminophen (TYLENOL) 650 MG CR tablet, Take by mouth., Disp: , Rfl:    aspirin EC 81 MG tablet, Take by mouth., Disp: , Rfl:    ELIQUIS 5 MG TABS tablet, Take 5 mg by mouth 2 (two) times daily., Disp: , Rfl:    felodipine (PLENDIL) 10 MG 24 hr tablet, Take by mouth., Disp: , Rfl:    hydrALAZINE (APRESOLINE) 50 MG tablet, Take 50 mg by mouth 2 (two) times daily., Disp: , Rfl:    hydrochlorothiazide (HYDRODIURIL) 25 MG tablet, Take 25 mg by mouth daily., Disp: , Rfl:    losartan (COZAAR) 100 MG tablet, Take 100 mg by mouth daily., Disp: , Rfl:    metoprolol succinate (TOPROL-XL) 25 MG 24 hr tablet, Take by mouth., Disp: , Rfl:    omeprazole (PRILOSEC) 20 MG capsule, TAKE 1 CAPSULE DAILY, Disp: , Rfl:    oxyCODONE (OXY IR/ROXICODONE) 5 MG immediate release tablet, Take 1 tablet (5 mg total) by mouth every 6 (six) hours as needed for severe pain., Disp: 30 tablet, Rfl: 0   potassium chloride SA (KLOR-CON) 20 MEQ tablet, TAKE 2 TABLETS ONCE DAILY, Disp: , Rfl:    oxyCODONE ER (XTAMPZA ER) 9 MG C12A, Take 9 mg by mouth daily., Disp: 30 capsule, Rfl: 0  Physical exam:  Vitals:   12/13/22 1208  BP: (!) 145/81  Pulse: 80  Resp: 16  Temp: (!) 96 F (35.6 C)  TempSrc: Tympanic  SpO2: 98%  Weight: 122 lb (55.3 kg)   Physical Exam Cardiovascular:     Rate and Rhythm: Normal rate and regular rhythm.     Heart sounds: Normal heart sounds.  Pulmonary:     Effort: Pulmonary effort is normal.     Breath sounds: Normal breath sounds.  Skin:     General: Skin is warm and dry.  Neurological:     Mental Status: She is alert and oriented to person, place, and time.         Latest Ref Rng & Units 04/23/2019    5:56 PM  CMP  Creatinine 0.44 - 1.00 mg/dL 0.80        No data to display          No images are attached to the encounter.  NM PET Image Initial (PI) Skull Base To Thigh  Result Date: 12/12/2022 CLINICAL DATA:  Initial treatment strategy for metastatic squamous cell carcinoma to a right cervical lymph node. History of squamous cell carcinoma of the right ear. EXAM: NUCLEAR MEDICINE PET SKULL BASE TO THIGH TECHNIQUE: 6.41 mCi F-18 FDG was injected intravenously. Full-ring PET imaging was performed from the skull base to thigh after  the radiotracer. CT data was obtained and used for attenuation correction and anatomic localization. Fasting blood glucose: 98 mg/dl COMPARISON:  CT neck 10/26/2022. FINDINGS: Mediastinal blood pool activity: SUV max 1.5 NECK: The previously demonstrated biopsied mass laterally in the upper right neck has enlarged and is intensely hypermetabolic. This measures 4.5 x 4.6 cm on image 39/2 and has an SUV max of 14.7. On previous CT, this measured 3.4 cm maximally. No other enlarged or hypermetabolic cervical lymph nodes are identified.Fairly symmetric activity within the lymphoid tissue of Waldeyer's ring is within physiologic limits.No suspicious activity identified within the pharyngeal mucosal space. Incidental CT findings: Bilateral carotid atherosclerosis. CHEST: Widespread pulmonary metastatic disease bilaterally. Representative lesions include an anterior right upper lobe nodule measuring 9 x 8 mm on image 93/2 (SUV max 6.7). A 1.1 x 1.1 cm nodule posteriorly in the left upper lobe on image 70/2 has an SUV max of 9.3. More than 30 nodules are identified. Hypermetabolic activity in the right infrahilar region (SUV max 5.4) is likely due to a central pulmonary nodule. Aside from this, no evidence of  hypermetabolic mediastinal, hilar or axillary adenopathy. Incidental CT findings: Atrophic or surgically absent left thyroid lobe. Aortic and branch vessel atherosclerosis. Large hiatal hernia. ABDOMEN/PELVIS: There is no hypermetabolic activity within the liver, adrenal glands, spleen or pancreas. There is no hypermetabolic nodal activity in the abdomen or pelvis. Incidental CT findings: Aortic and branch vessel atherosclerosis. Diverticulosis of the distal colon. SKELETON: There is no hypermetabolic activity to suggest osseous metastatic disease. Incidental CT findings: Convex right thoracolumbar scoliosis with associated spondylosis. IMPRESSION: 1. Widespread pulmonary metastatic disease. 2. The previously biopsied mass laterally in the upper right neck has enlarged and is intensely hypermetabolic, consistent with metastatic squamous cell carcinoma. 3. No evidence of metastatic disease in the abdomen or pelvis. 4.  Aortic Atherosclerosis (ICD10-I70.0). Electronically Signed   By: Richardean Sale M.D.   On: 12/12/2022 10:21     Assessment and plan- Patient is a 88 y.o. 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  PET scan shows hypermetabolic in the level 2 cervicalNode which measured 4.5 x 4.6 cm.  There was no focal hypermetabolism noted anywhere in the head and neck region to identify the site of the primary.  She also had several bilateral pulmonary nodules which were hypermetabolic and therefore consistent with metastatic disease.  Patient therefore has stage IVc disease and treatment would be palliative and not curative.  Options moving forward include:  1.  Palliative radiation therapy alone to the cervical lymph node which can potentially shrink the node and move it away from the carotid vessels and also  potentially help her with the pain.  However radiation would involve daily treatments for 7 weeks and can be associated with risk of mucositis, dysphagia and xerostomia long-term as well.  She would likely require feeding tube placement if she had to go through palliative radiation.  Patient does not want to go through this option 2.  Patient is also not interested in trying any combination chemoimmunotherapy which would be difficult for her to tolerate at her age 88.  Single agent Beryle Flock is an option given that her CPS score is 10 although typically single agent immunotherapy is recommended for CPS score of greater than 20.  Discussed risks and benefits of Keytruda including all  but not limited to autoimmune side effects such as endocrinopathies including thyroiditis, pneumonitis, colitis and need to monitor kidney and liver functions.  Overall survival with single agent Beryle Flock is about 12 months with treatment.  More than prolonging her overall survival goal with single agent Keytruda would be to see if the cervical node can shrink and help her with pain  3.  Not opting for any systemic therapy and proceeding with best supportive care/hospice with focus on pain control.  I am adding Xtampza ER to her long-acting pain regimen in addition to as needed oxycodone that patient has.  We could also consider adding medications for neuropathic pain such as duloxetine or gabapentin down the line should her pain remain uncontrolled.  Patient at this time is leaning towards option 3 but she will think over with her granddaughter and let us know in the next few days.  Follow-up with me to be decided based on that.   Cancer Staging  Squamous cell carcinoma metastatic to lymph nodes of head and neck [facility]) Staging form: Cervical Lymph Nodes and Unknown Primary Tumors of the Head and Neck, AJCC 8th Edition - Clinical stage from 12/13/2022: Stage IVC (cT0, cN2a, cM1) - Signed by Sindy Guadeloupe, MD on 12/14/2022      Visit Diagnosis 1. Head and neck cancer ([place])   2. Goals of care, counseling/discussion   3. Squamous cell carcinoma metastatic to lymph nodes of head and neck (HCC)   4. Neoplasm related pain      Dr. Randa Evens, MD, MPH [facility] at [facility] [phone] 12/14/2022 7:31 AM

## 2022-12-20 ENCOUNTER — Telehealth: Payer: Self-pay

## 2022-12-20 ENCOUNTER — Inpatient Hospital Stay: Payer: Medicare Other | Attending: Oncology | Admitting: Hospice and Palliative Medicine

## 2022-12-20 DIAGNOSIS — C760 Malignant neoplasm of head, face and neck: Secondary | ICD-10-CM | POA: Diagnosis not present

## 2022-12-20 NOTE — Telephone Encounter (Signed)
Patient's granddaughter called to inform you patient does not want to proceed with immunotherapy treatment. She and patient are considering hospice but still are not sure and has questions regarding hospice/palliative. Request to speak to [facility] regarding next steps. Dr Janese Banks agrees. Josh informed. Apt to be scheduled for phone encounter this afternoon

## 2022-12-20 NOTE — Progress Notes (Signed)
Virtual Visit via Telephone Note  I connected with Victoria Pearson on 12/20/22 at  1:45 PM EST by telephone and verified that I am speaking with the correct person using two identifiers.  Location: Patient: Home Provider: Clinic   I discussed the limitations, risks, security and privacy concerns of performing an evaluation and management service by telephone and the availability of in person appointments. I also discussed with the patient that there may be a patient responsible charge related to this service. The patient expressed understanding and agreed to proceed.   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  She has opted to forego cancer treatment.  Palliative care was consulted to address goals.   Observations/Objective: I called and spoke with patient and granddaughter.  Patient is hard of hearing and had difficulty communicating via telephone.  She deferred to granddaughter. Granddaughter feels like patient is declining fairly quickly. Granddaughter says that patient is not interested in pursuing treatment for cancer and instead wants to focus on comfort at home.  We discussed the differences between palliative care and hospice.  Patient/granddaughter would like to proceed with a referral to [facility] hospice.  They are interested in possible future utilization of the [facility]ice [facility] as patient lives at home alone.   We discussed symptom management.  Pain is currently well-controlled on current regimen.  We discussed CODE STATUS and patient/granddaughter confirmed DNR/DNI.  Will have hospice provide her with a DNR order.  I called and gave a verbal order to the Hospice referral coordinator.   Assessment and Plan: Stage IV head neck cancer -referral to hospice.  DNR/DNI.  Neoplasm related pain -continue Xtampza/oxycodone IR.  Discussed with granddaughter that hospice  formulary may require transitioning to a different long-acting opioid.  Daily bowel regimen.  Follow Up Instructions: As needed   I discussed the assessment and treatment plan with the patient. The patient was provided an opportunity to ask questions and all were answered. The patient agreed with the plan and demonstrated an understanding of the instructions.   The patient was advised to call back or seek an in-person evaluation if the symptoms worsen or if the condition fails to improve as anticipated.  I provided 15 minutes of non-face-to-face time during this encounter.   Irean Hong, NP

## 2022-12-22 ENCOUNTER — Other Ambulatory Visit: Payer: Self-pay | Admitting: Hospice and Palliative Medicine

## 2022-12-22 MED ORDER — OXYCODONE HCL 5 MG PO TABS: 5.0000 mg | ORAL_TABLET | Freq: Four times a day (QID) | ORAL | 0 refills | Status: DC | PRN

## 2022-12-22 NOTE — Progress Notes (Signed)
Hospice nurse requested refill of oxycodone.  

## 2023-01-11 ENCOUNTER — Other Ambulatory Visit: Payer: Self-pay

## 2023-01-11 MED ORDER — XTAMPZA ER 9 MG PO C12A: 9.0000 mg | EXTENDED_RELEASE_CAPSULE | Freq: Every day | ORAL | 0 refills | Status: DC

## 2023-01-11 NOTE — Telephone Encounter (Signed)
Brandy from [facility] requesting refill on patients oxycode '9mg'$  once a day to [facility] [place]

## 2023-01-17 ENCOUNTER — Other Ambulatory Visit: Payer: Self-pay | Admitting: Hospice and Palliative Medicine

## 2023-01-17 MED ORDER — MORPHINE SULFATE ER 15 MG PO TBCR: 15.0000 mg | EXTENDED_RELEASE_TABLET | Freq: Two times a day (BID) | ORAL | 0 refills | Status: DC

## 2023-01-17 MED ORDER — OXYCODONE HCL 5 MG PO TABS: 5.0000 mg | ORAL_TABLET | ORAL | 0 refills | Status: AC | PRN

## 2023-01-17 NOTE — Progress Notes (Signed)
Received a call from hospice nurse, Adin. Patient declining. She needed a refill of oxycodone. Also, Ginger Organ is not on hospice formulary so will switch to MS Contin '15mg'$  Q12H

## 2023-01-18 ENCOUNTER — Other Ambulatory Visit: Payer: Self-pay | Admitting: Hospice and Palliative Medicine

## 2023-01-18 MED ORDER — NITROFURANTOIN MONOHYD MACRO 100 MG PO CAPS: 100.0000 mg | ORAL_CAPSULE | Freq: Two times a day (BID) | ORAL | 0 refills | Status: AC

## 2023-01-18 NOTE — Progress Notes (Signed)
Call from hospice nurse, [place].  Patient with urinary urgency/burning, intermittent confusion.  No fever or chills.  Nurse and family are concerned for UTI.  RN obtained UA and culture earlier today.  Will start empirically on Macrobid 100 mg twice daily x 5 days.

## 2023-01-30 ENCOUNTER — Other Ambulatory Visit: Payer: Self-pay | Admitting: Hospice and Palliative Medicine

## 2023-01-30 MED ORDER — MORPHINE SULFATE ER 15 MG PO TBCR: 15.0000 mg | EXTENDED_RELEASE_TABLET | Freq: Three times a day (TID) | ORAL | 0 refills | Status: AC

## 2023-01-30 NOTE — Progress Notes (Unsigned)
Hospice nurse, Mitzie, patient is having breakthrough pain at night. Will increase frequency of MS Contin to 22m Q8H. Refill sent.   Patient is reportedly interested in discontinuing some of her chronic meds to reduce pill burden. RN to discuss with patient in more detail.

## 2023-02-16 DEATH — deceased
